# Patient Record
Sex: Female | Born: 1980 | Race: White | Hispanic: No | Smoking: Current every day smoker
Health system: Southern US, Community
[De-identification: ages and names within clinical notes are randomized; demographics above are authoritative.]

## PROBLEM LIST (undated history)

## (undated) DIAGNOSIS — D259 Leiomyoma of uterus, unspecified: Secondary | ICD-10-CM

## (undated) DIAGNOSIS — IMO0002 Reserved for concepts with insufficient information to code with codable children: Secondary | ICD-10-CM

## (undated) DIAGNOSIS — F1020 Alcohol dependence, uncomplicated: Secondary | ICD-10-CM

## (undated) DIAGNOSIS — N809 Endometriosis, unspecified: Secondary | ICD-10-CM

## (undated) HISTORY — PX: OTHER SURGICAL HISTORY: SHX169

---

## 2006-01-20 ENCOUNTER — Ambulatory Visit: Payer: Self-pay | Admitting: Obstetrics and Gynecology

## 2008-02-20 ENCOUNTER — Emergency Department: Payer: Self-pay | Admitting: Emergency Medicine

## 2015-08-26 DIAGNOSIS — F1010 Alcohol abuse, uncomplicated: Secondary | ICD-10-CM | POA: Insufficient documentation

## 2015-08-26 HISTORY — DX: Alcohol abuse, uncomplicated: F10.10

## 2016-02-17 ENCOUNTER — Emergency Department
Admission: EM | Admit: 2016-02-17 | Discharge: 2016-02-17 | Disposition: A | Discharge status: Home / Self Care | Payer: Self-pay | Attending: Emergency Medicine | Admitting: Emergency Medicine

## 2016-02-17 ENCOUNTER — Emergency Department: Payer: Self-pay

## 2016-02-17 ENCOUNTER — Encounter: Payer: Self-pay | Admitting: Medical Oncology

## 2016-02-17 DIAGNOSIS — R0781 Pleurodynia: Secondary | ICD-10-CM

## 2016-02-17 DIAGNOSIS — R05 Cough: Secondary | ICD-10-CM | POA: Insufficient documentation

## 2016-02-17 DIAGNOSIS — F17200 Nicotine dependence, unspecified, uncomplicated: Secondary | ICD-10-CM | POA: Insufficient documentation

## 2016-02-17 DIAGNOSIS — R059 Cough, unspecified: Secondary | ICD-10-CM

## 2016-02-17 DIAGNOSIS — R071 Chest pain on breathing: Secondary | ICD-10-CM | POA: Insufficient documentation

## 2016-02-17 MED ORDER — IBUPROFEN 800 MG PO TABS: 800.0000 mg | ORAL_TABLET | Freq: Three times a day (TID) | ORAL | Status: DC | PRN

## 2016-02-17 MED ORDER — GUAIFENESIN-CODEINE 100-10 MG/5ML PO SOLN: 10.0000 mL | ORAL | Status: DC | PRN

## 2016-02-17 MED ORDER — BACLOFEN 10 MG PO TABS: 10.0000 mg | ORAL_TABLET | Freq: Three times a day (TID) | ORAL | Status: DC

## 2016-02-17 NOTE — ED Notes (Signed)
Developed a cough about 4-5 days ago  But now having pain to left lateral rib area with cough and movement  Denies any trauma
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
## 2016-02-17 NOTE — ED Notes (Signed)
Pt reports that she has had a cough x 2 days, yesterday she noted that she was having left sided rib cage pain with bruising to area. Pt denies sob.

## 2016-02-17 NOTE — Discharge Instructions (Signed)
Nonspecific Chest Pain  Chest pain can be caused by many different conditions. There is always a chance that your pain could be related to something serious, such as a heart attack or a blood clot in your lungs. Chest pain can also be caused by conditions that are not life-threatening. If you have chest pain, it is very important to follow up with your health care provider. CAUSES  Chest pain can be caused by:  Heartburn.  Pneumonia or bronchitis.  Anxiety or stress.  Inflammation around your heart (pericarditis) or lung (pleuritis or pleurisy).  A blood clot in your lung.  A collapsed lung (pneumothorax). It can develop suddenly on its own (spontaneous pneumothorax) or from trauma to the chest.  Shingles infection (varicella-zoster virus).  Heart attack.  Damage to the bones, muscles, and cartilage that make up your chest wall. This can include:  Bruised bones due to injury.  Strained muscles or cartilage due to frequent or repeated coughing or overwork.  Fracture to one or more ribs.  Sore cartilage due to inflammation (costochondritis). RISK FACTORS  Risk factors for chest pain may include:  Activities that increase your risk for trauma or injury to your chest.  Respiratory infections or conditions that cause frequent coughing.  Medical conditions or overeating that can cause heartburn.  Heart disease or family history of heart disease.  Conditions or health behaviors that increase your risk of developing a blood clot.  Having had chicken pox (varicella zoster). SIGNS AND SYMPTOMS Chest pain can feel like:  Burning or tingling on the surface of your chest or deep in your chest.  Crushing, pressure, aching, or squeezing pain.  Dull or sharp pain that is worse when you move, cough, or take a deep breath.  Pain that is also felt in your back, neck, shoulder, or arm, or pain that spreads to any of these areas. Your chest pain may come and go, or it may stay  constant. DIAGNOSIS Lab tests or other studies may be needed to find the cause of your pain. Your health care provider may have you take a test called an ambulatory ECG (electrocardiogram). An ECG records your heartbeat patterns at the time the test is performed. You may also have other tests, such as:  Transthoracic echocardiogram (TTE). During echocardiography, sound waves are used to create a picture of all of the heart structures and to look at how blood flows through your heart.  Transesophageal echocardiogram (TEE).This is a more advanced imaging test that obtains images from inside your body. It allows your health care provider to see your heart in finer detail.  Cardiac monitoring. This allows your health care provider to monitor your heart rate and rhythm in real time.  Holter monitor. This is a portable device that records your heartbeat and can help to diagnose abnormal heartbeats. It allows your health care provider to track your heart activity for several days, if needed.  Stress tests. These can be done through exercise or by taking medicine that makes your heart beat more quickly.  Blood tests.  Imaging tests. TREATMENT  Your treatment depends on what is causing your chest pain. Treatment may include:  Medicines. These may include:  Acid blockers for heartburn.  Anti-inflammatory medicine.  Pain medicine for inflammatory conditions.  Antibiotic medicine, if an infection is present.  Medicines to dissolve blood clots.  Medicines to treat coronary artery disease.  Supportive care for conditions that do not require medicines. This may include:  Resting.  Applying heat  or cold packs to injured areas.  Limiting activities until pain decreases. HOME CARE INSTRUCTIONS  If you were prescribed an antibiotic medicine, finish it all even if you start to feel better.  Avoid any activities that bring on chest pain.  Do not use any tobacco products, including  cigarettes, chewing tobacco, or electronic cigarettes. If you need help quitting, ask your health care provider.  Do not drink alcohol.  Take medicines only as directed by your health care provider.  Keep all follow-up visits as directed by your health care provider. This is important. This includes any further testing if your chest pain does not go away.  If heartburn is the cause for your chest pain, you may be told to keep your head raised (elevated) while sleeping. This reduces the chance that acid will go from your stomach into your esophagus.  Make lifestyle changes as directed by your health care provider. These may include:  Getting regular exercise. Ask your health care provider to suggest some activities that are safe for you.  Eating a heart-healthy diet. A registered dietitian can help you to learn healthy eating options.  Maintaining a healthy weight.  Managing diabetes, if necessary.  Reducing stress. SEEK MEDICAL CARE IF:  Your chest pain does not go away after treatment.  You have a rash with blisters on your chest.  You have a fever. SEEK IMMEDIATE MEDICAL CARE IF:   Your chest pain is worse.  You have an increasing cough, or you cough up blood.  You have severe abdominal pain.  You have severe weakness.  You faint.  You have chills.  You have sudden, unexplained chest discomfort.  You have sudden, unexplained discomfort in your arms, back, neck, or jaw.  You have shortness of breath at any time.  You suddenly start to sweat, or your skin gets clammy.  You feel nauseous or you vomit.  You suddenly feel light-headed or dizzy.  Your heart begins to beat quickly, or it feels like it is skipping beats. These symptoms may represent a serious problem that is an emergency. Do not wait to see if the symptoms will go away. Get medical help right away. Call your local emergency services (911 in the [place] Do not drive yourself to the hospital.   This  information is not intended to replace advice given to you by your health care provider. Make sure you discuss any questions you have with your health care provider.   Document Released: 08/25/2005 Document Revised: 12/06/2014 Document Reviewed: 06/21/2014 Elsevier Interactive Patient Education 2016 [employer]  Cough, Adult Coughing is a reflex that clears your throat and your airways. Coughing helps to heal and protect your lungs. It is normal to cough occasionally, but a cough that happens with other symptoms or lasts a long time may be a sign of a condition that needs treatment. A cough may last only 2-3 weeks (acute), or it may last longer than 8 weeks (chronic). CAUSES Coughing is commonly caused by:  Breathing in substances that irritate your lungs.  A viral or bacterial respiratory infection.  Allergies.  Asthma.  Postnasal drip.  Smoking.  Acid backing up from the stomach into the esophagus (gastroesophageal reflux).  Certain medicines.  Chronic lung problems, including COPD (or rarely, lung cancer).  Other medical conditions such as heart failure. HOME CARE INSTRUCTIONS  Pay attention to any changes in your symptoms. Take these actions to help with your discomfort:  Take medicines only as told by your health  care provider.  If you were prescribed an antibiotic medicine, take it as told by your health care provider. Do not stop taking the antibiotic even if you start to feel better.  Talk with your health care provider before you take a cough suppressant medicine.  Drink enough fluid to keep your urine clear or pale yellow.  If the air is dry, use a cold steam vaporizer or humidifier in your bedroom or your home to help loosen secretions.  Avoid anything that causes you to cough at work or at home.  If your cough is worse at night, try sleeping in a semi-upright position.  Avoid cigarette smoke. If you smoke, quit smoking. If you need help quitting, ask your  health care provider.  Avoid caffeine.  Avoid alcohol.  Rest as needed. SEEK MEDICAL CARE IF:   You have new symptoms.  You cough up pus.  Your cough does not get better after 2-3 weeks, or your cough gets worse.  You cannot control your cough with suppressant medicines and you are losing sleep.  You develop pain that is getting worse or pain that is not controlled with pain medicines.  You have a fever.  You have unexplained weight loss.  You have night sweats. SEEK IMMEDIATE MEDICAL CARE IF:  You cough up blood.  You have difficulty breathing.  Your heartbeat is very fast.   This information is not intended to replace advice given to you by your health care provider. Make sure you discuss any questions you have with your health care provider.   Document Released: 05/14/2011 Document Revised: 08/06/2015 Document Reviewed: 01/22/2015 Elsevier Interactive Patient Education [employer].

## 2016-02-17 NOTE — ED Notes (Signed)
Pt alert and oriented X4, active, cooperative, pt in NAD. RR even and unlabored, color WNL.  Pt informed to return if any life threatening symptoms occur.   

## 2016-08-01 ENCOUNTER — Emergency Department
Admission: EM | Admit: 2016-08-01 | Discharge: 2016-08-01 | Disposition: A | Discharge status: Home / Self Care | Payer: Self-pay | Attending: Emergency Medicine | Admitting: Emergency Medicine

## 2016-08-01 ENCOUNTER — Emergency Department: Payer: Self-pay

## 2016-08-01 ENCOUNTER — Encounter: Payer: Self-pay | Admitting: Emergency Medicine

## 2016-08-01 DIAGNOSIS — S0990XA Unspecified injury of head, initial encounter: Secondary | ICD-10-CM | POA: Insufficient documentation

## 2016-08-01 DIAGNOSIS — S0083XA Contusion of other part of head, initial encounter: Secondary | ICD-10-CM | POA: Insufficient documentation

## 2016-08-01 DIAGNOSIS — Y929 Unspecified place or not applicable: Secondary | ICD-10-CM | POA: Insufficient documentation

## 2016-08-01 DIAGNOSIS — Y9389 Activity, other specified: Secondary | ICD-10-CM | POA: Insufficient documentation

## 2016-08-01 DIAGNOSIS — F1020 Alcohol dependence, uncomplicated: Secondary | ICD-10-CM | POA: Insufficient documentation

## 2016-08-01 DIAGNOSIS — F17210 Nicotine dependence, cigarettes, uncomplicated: Secondary | ICD-10-CM | POA: Insufficient documentation

## 2016-08-01 DIAGNOSIS — Y999 Unspecified external cause status: Secondary | ICD-10-CM | POA: Insufficient documentation

## 2016-08-01 HISTORY — DX: Alcohol dependence, uncomplicated: F10.20

## 2016-08-01 HISTORY — DX: Endometriosis, unspecified: N80.9

## 2016-08-01 HISTORY — DX: Reserved for concepts with insufficient information to code with codable children: IMO0002

## 2016-08-01 HISTORY — DX: Leiomyoma of uterus, unspecified: D25.9
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
## 2016-12-03 ENCOUNTER — Emergency Department: Payer: Self-pay

## 2016-12-03 ENCOUNTER — Emergency Department
Admission: EM | Admit: 2016-12-03 | Discharge: 2016-12-03 | Disposition: A | Discharge status: Home / Self Care | Payer: Self-pay | Attending: Emergency Medicine | Admitting: Emergency Medicine

## 2016-12-03 ENCOUNTER — Encounter: Payer: Self-pay | Admitting: Emergency Medicine

## 2016-12-03 DIAGNOSIS — F17210 Nicotine dependence, cigarettes, uncomplicated: Secondary | ICD-10-CM | POA: Insufficient documentation

## 2016-12-03 DIAGNOSIS — Y939 Activity, unspecified: Secondary | ICD-10-CM | POA: Insufficient documentation

## 2016-12-03 DIAGNOSIS — Y929 Unspecified place or not applicable: Secondary | ICD-10-CM | POA: Insufficient documentation

## 2016-12-03 DIAGNOSIS — W19XXXA Unspecified fall, initial encounter: Secondary | ICD-10-CM | POA: Insufficient documentation

## 2016-12-03 DIAGNOSIS — Y999 Unspecified external cause status: Secondary | ICD-10-CM | POA: Insufficient documentation

## 2016-12-03 DIAGNOSIS — S20212A Contusion of left front wall of thorax, initial encounter: Secondary | ICD-10-CM | POA: Insufficient documentation

## 2016-12-03 DIAGNOSIS — R109 Unspecified abdominal pain: Secondary | ICD-10-CM | POA: Insufficient documentation

## 2016-12-03 MED ORDER — DIAZEPAM 5 MG PO TABS: 5.0000 mg | ORAL_TABLET | Freq: Three times a day (TID) | ORAL | 0 refills | Status: DC | PRN

## 2016-12-03 MED ORDER — OXYCODONE-ACETAMINOPHEN 5-325 MG PO TABS: 2.0000 | ORAL_TABLET | Freq: Four times a day (QID) | ORAL | 0 refills | Status: DC | PRN

## 2016-12-03 MED ORDER — KETOROLAC TROMETHAMINE 60 MG/2ML IM SOLN
60.0000 mg | Freq: Once | INTRAMUSCULAR | Status: AC
  Administered 2016-12-03: 60 mg via INTRAMUSCULAR
  Filled 2016-12-03: qty 2

## 2016-12-03 MED ORDER — IBUPROFEN 800 MG PO TABS: 800.0000 mg | ORAL_TABLET | Freq: Three times a day (TID) | ORAL | 0 refills | Status: DC | PRN
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
## 2017-04-24 IMAGING — CR DG RIBS W/ CHEST 3+V*L*
4 series · 4 of 4 positions shown · non-contrast
Comparison: None.

CLINICAL DATA: Cough for 2 days, left side rib cage pain

EXAM:
LEFT RIBS AND CHEST - 3+ VIEW

[chest pa]
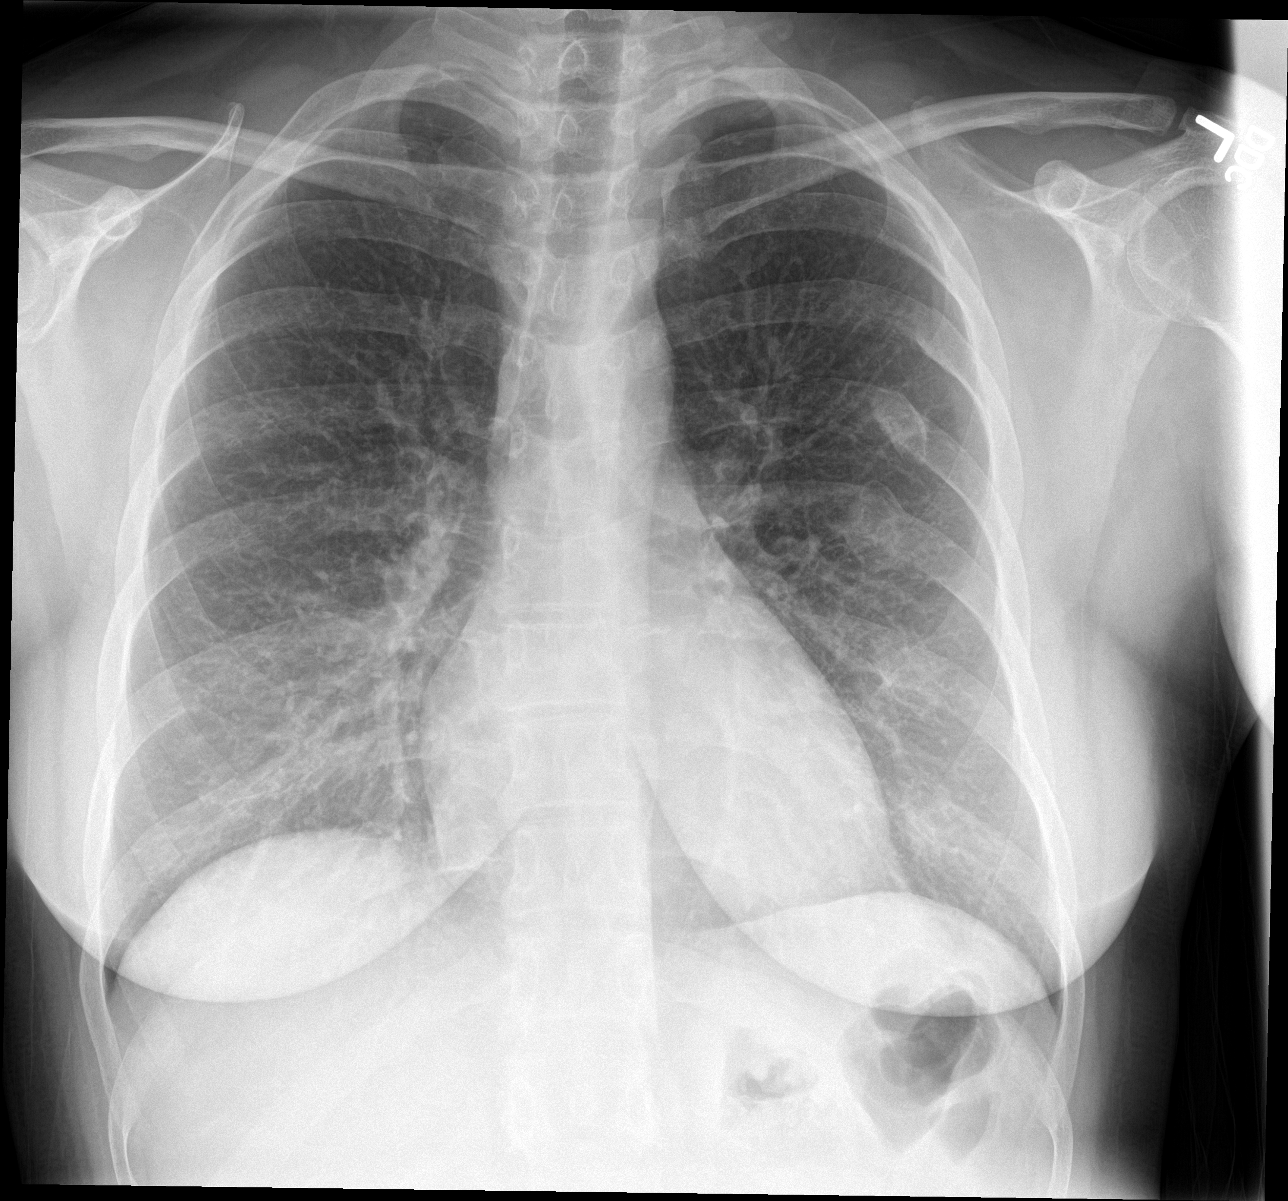

[rib pa]
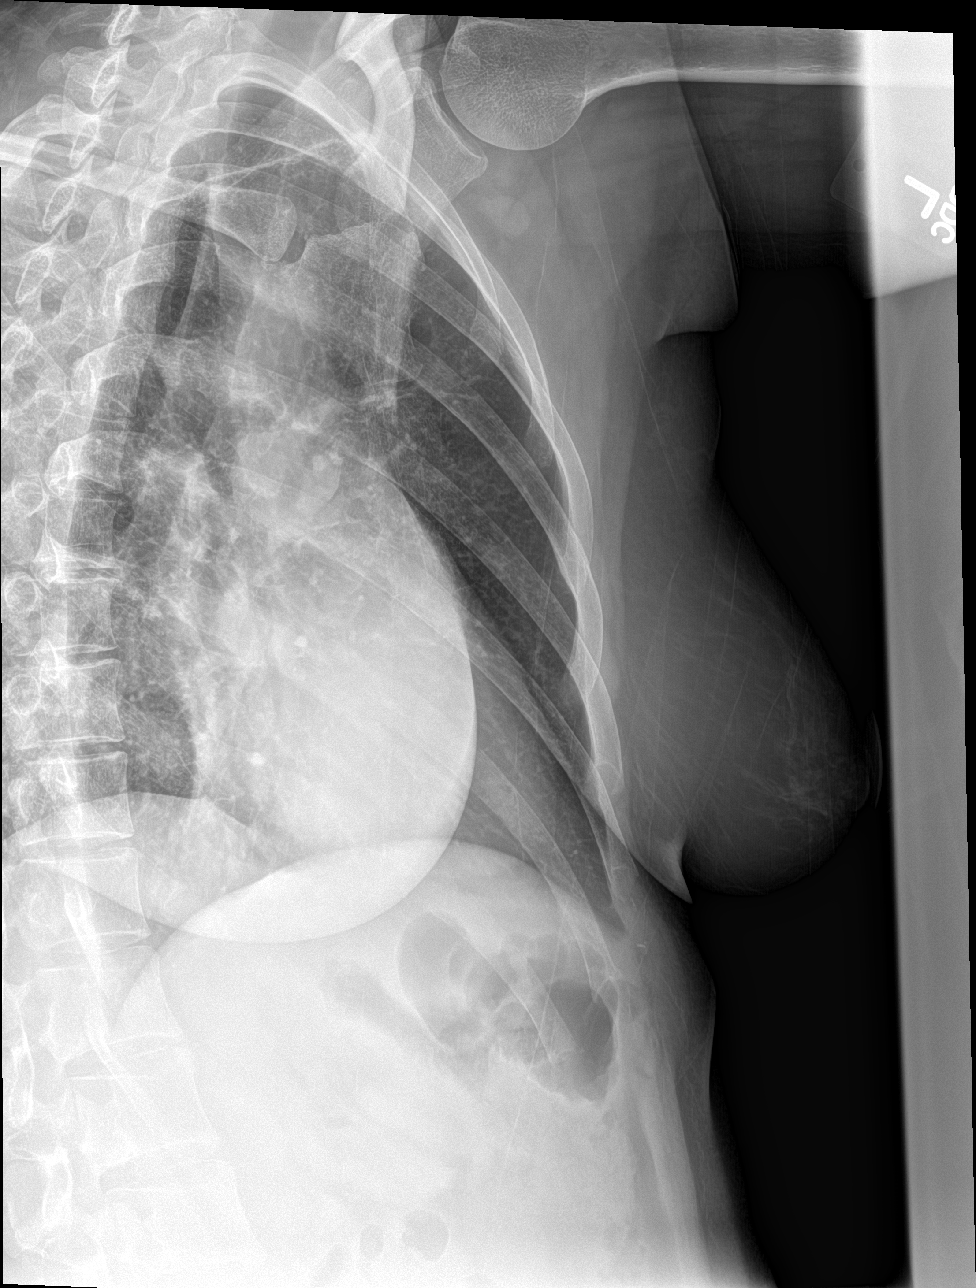

[rib pa obl]
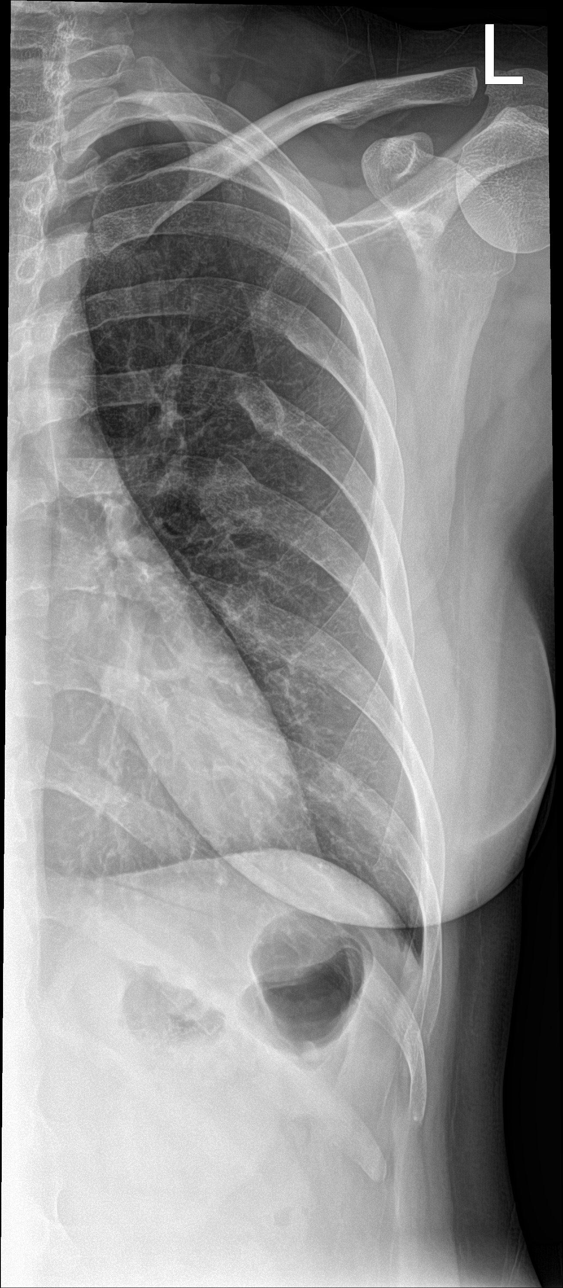

[rib ap]
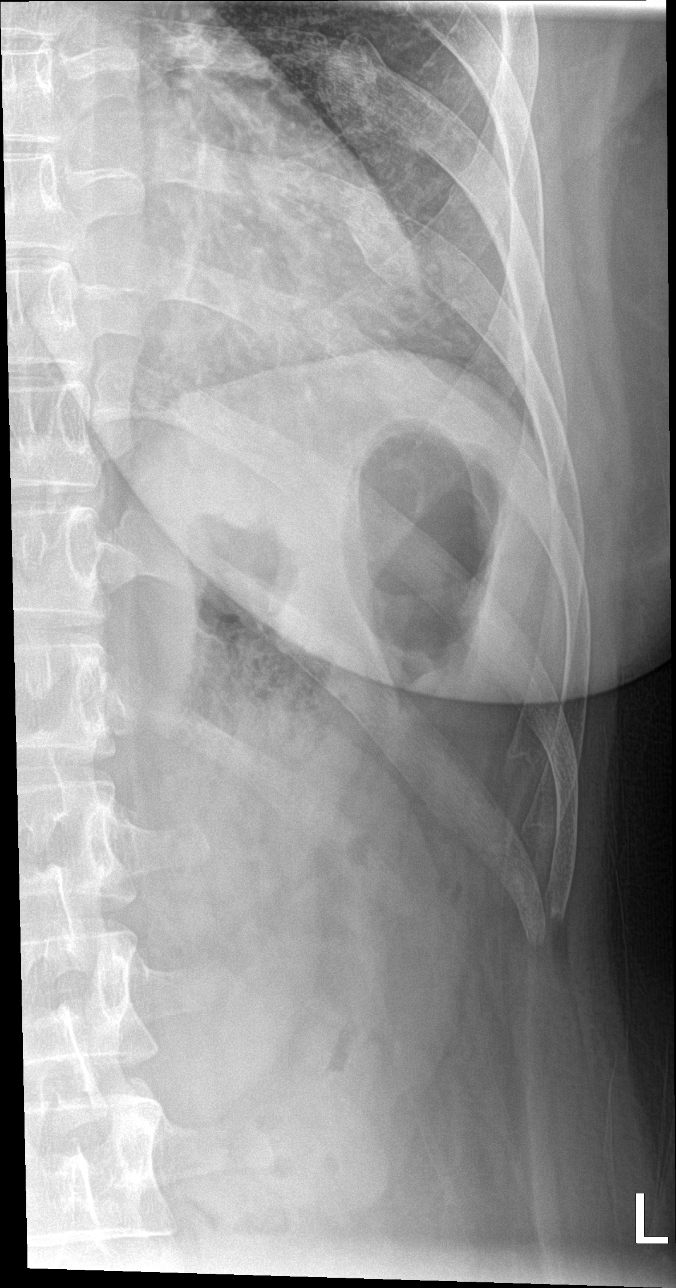

[4 of 4 positions shown; findings below may reference images not displayed]

FINDINGS: Four views left ribs submitted. No acute infiltrate or pulmonary
edema. Old fracture of the left posterior fifth, sixth, seventh and
probable eighth rib. There is no pneumothorax. No definite acute rib
fracture is identified.
IMPRESSION: No acute rib fracture is identified. Old fractures of the left
fifth, 6, 7 and probable eighth rib.

## 2018-01-18 ENCOUNTER — Encounter: Payer: Self-pay | Admitting: Intensive Care

## 2018-01-18 ENCOUNTER — Emergency Department
Admission: EM | Admit: 2018-01-18 | Discharge: 2018-01-18 | Disposition: A | Discharge status: Home / Self Care | Payer: Self-pay | Attending: Emergency Medicine | Admitting: Emergency Medicine

## 2018-01-18 DIAGNOSIS — F17210 Nicotine dependence, cigarettes, uncomplicated: Secondary | ICD-10-CM | POA: Insufficient documentation

## 2018-01-18 DIAGNOSIS — Z79899 Other long term (current) drug therapy: Secondary | ICD-10-CM | POA: Insufficient documentation

## 2018-01-18 DIAGNOSIS — J111 Influenza due to unidentified influenza virus with other respiratory manifestations: Secondary | ICD-10-CM | POA: Insufficient documentation

## 2018-01-18 MED ORDER — OSELTAMIVIR PHOSPHATE 75 MG PO CAPS: 75.0000 mg | ORAL_CAPSULE | Freq: Two times a day (BID) | ORAL | 0 refills | Status: AC

## 2018-01-18 NOTE — ED Triage Notes (Signed)
Pt c/o cough, sore throat, and body aches for a few days. Last took theraflu and ibuprofen around 12

## 2018-01-18 NOTE — ED Notes (Signed)
See triage note   States she developed body aches ,vomiting and diarrhea for the past 2-3 days   Afebrile on arrival last time vomiting was this am
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
## 2019-02-28 ENCOUNTER — Encounter: Payer: Self-pay | Admitting: Emergency Medicine

## 2019-02-28 ENCOUNTER — Emergency Department: Payer: Self-pay

## 2019-02-28 ENCOUNTER — Emergency Department
Admission: EM | Admit: 2019-02-28 | Discharge: 2019-02-28 | Disposition: A | Discharge status: Home / Self Care | Payer: Self-pay | Attending: Student in an Organized Health Care Education/Training Program | Admitting: Student in an Organized Health Care Education/Training Program

## 2019-02-28 ENCOUNTER — Other Ambulatory Visit: Payer: Self-pay

## 2019-02-28 DIAGNOSIS — R6889 Other general symptoms and signs: Secondary | ICD-10-CM

## 2019-02-28 DIAGNOSIS — R509 Fever, unspecified: Secondary | ICD-10-CM | POA: Insufficient documentation

## 2019-02-28 DIAGNOSIS — R1011 Right upper quadrant pain: Secondary | ICD-10-CM | POA: Insufficient documentation

## 2019-02-28 DIAGNOSIS — R109 Unspecified abdominal pain: Secondary | ICD-10-CM

## 2019-02-28 DIAGNOSIS — J111 Influenza due to unidentified influenza virus with other respiratory manifestations: Secondary | ICD-10-CM | POA: Insufficient documentation

## 2019-02-28 DIAGNOSIS — F17210 Nicotine dependence, cigarettes, uncomplicated: Secondary | ICD-10-CM | POA: Insufficient documentation

## 2019-02-28 LAB — URINALYSIS, COMPLETE (UACMP) WITH MICROSCOPIC
Bacteria, UA: NONE SEEN
RBC / HPF: >50 RBC/hpf — ABNORMAL HIGH (ref 0–5)
Specific Gravity, Urine: 1.022 (ref 1.005–1.030)

## 2019-02-28 LAB — POCT PREGNANCY, URINE: Preg Test, Ur: NEGATIVE

## 2019-02-28 MED ORDER — ACETAMINOPHEN 500 MG PO TABS
1000.0000 mg | ORAL_TABLET | Freq: Once | ORAL | Status: AC
  Administered 2019-02-28: 1000 mg via ORAL
  Filled 2019-02-28: qty 2

## 2019-02-28 MED ORDER — BACLOFEN 10 MG PO TABS: 10.0000 mg | ORAL_TABLET | Freq: Three times a day (TID) | ORAL | 0 refills | Status: DC | PRN

## 2019-02-28 MED ORDER — HYDROCODONE-ACETAMINOPHEN 5-325 MG PO TABS: 1.0000 | ORAL_TABLET | ORAL | 0 refills | Status: DC | PRN

## 2019-02-28 NOTE — ED Triage Notes (Signed)
Pt in via POV, reports cough, congestion, body aches x 3 days.  Ambulatory to triage, NAD noted at this time.

## 2019-02-28 NOTE — ED Notes (Signed)
Patient transported to Ultrasound 

## 2019-02-28 NOTE — Discharge Instructions (Signed)
You have a viral illness which can have symptoms like muscle aches, fevers, chills, runny nose, cough, sneezing, sore throat, vomiting or diarrhea. One of the viruses that can cause this is SARS- CoV-2, the virus that causes COVID-19, also known as the novel coronavirus. You could also have a different viral infection such as the common cold or flu. Most patients with viral illness including COVID-19 have mild symptoms and recover on their own. Resting, staying hydrated, and sleeping are helpful. Today we think you are well enough to go home and treat your symptoms with oral liquids, and medicine for fevers, cough, and pain.  We generally do not do COVID-19 testing on people with mild symptoms who are being discharged from the Emergency Department or Clinic.   If we did a test for COVID-19 the results will not be available for several days. We will call you with the result. Please DO NOT CONTACT THE EMERGENCY DEPARTMENT OR CLINIC FOR RESULTS OF THIS TEST.   Please follow the precautions below:  Stay home for at least 7 days after your symptoms began OR for 3 days after your fever ends, whichever takes longer. ?  If people live with you they should also stay home and avoid contact with others for 14 days.?   ISOLATION GUIDANCE FOR POSSIBLE COVID Most people with cough and fever have an illness caused by a virus. One is COVID-19. Not all people with these infections are being tested for the virus that causes COVID-19. People who might have COVID but are not being tested should still try to prevent the spread of the infection.  These instructions are modified recommendations from the [place].  People who might have COVID-19 should follow the instructions below until a doctor or health department says they can stop.  Stay home unless you need to see a doctor Stop doing things outside your home except for getting medical care. Do not go to work, school,  or public areas; and do not use public transportation or taxis.  Call ahead before visiting the doctor Before your appointment, call the doctor's office and tell them about your symptoms. This will help them take steps to keep other people from getting infected.   Keep track of your symptoms Symptoms are the things you feel, like fever or trouble breathing. Go to your doctor or the ER if you think you are getting worse, like having more trouble breathing. Call the doctor's office and tell them about your symptoms. This will help them take steps to keep other people from getting sick.   Wear a face mask You should wear a face mask that covers your nose and mouth when you are in the same room with other people and when you visit the doctor's office. People who live with you or visit you should also wear a face mask when they are in the same room with you.  Separate yourself from other people in your home You should stay in a different room from other people in your home. You should stay separate from your family members as much as possible. You should use a separate bathroom if you can.  Avoid sharing things in your house Don't share dishes, drinking glasses, cups, eating utensils, towels, bedding, or other things with people in your home. After using these things please wash them really well with soap and water.  Cover your coughs and sneezes Cover your mouth and nose with a tissue when you  cough or sneeze, or you can cough or sneeze into your sleeve. Throw used tissues in a trash can that has a bag in it, and immediately wash your hands with soap and water for at least 20 seconds. If you use an alcohol-based hand rub please rub your hands together for 20 seconds.  Wash your [facility] your hands often and very well with soap and water for at least 20 seconds. If your hands are not visibly dirty you can use an alcohol-based hand sanitizer. Don't touch your eyes, nose, or mouth with unwashed  hands.    Instructions for People Helping Care for Patients with Possible COVID-19 Follow your doctor's instructions Make sure that you understand and can help the patient follow any instructions for care.  Provide for the patient's basic needs You should help the patient with basic needs in the home and provide support for getting groceries, prescriptions, and other personal needs.  Keep track of the patient's symptoms If they are getting sicker, call his or her doctor. This will help the doctor's office take steps to keep other people from getting infected.  Limit the number of people who have contact with the patient If possible, have only one caregiver for the patient. Other family members should stay in another home or place of residence. If they can't, they should stay in another room and stay separated from the patient as much as possible. Keep one bathroom JUST for the patient if you can.  Only allow visitors if they MUST be in the home.  Keep older adults, very young children, and other sick people away from the patient Keep older adults, very young children, and people who have compromised immune systems or chronic health conditions away from the patient. This includes people with chronic heart, lung, or kidney conditions, diabetes, and cancer.  Wash your hands often Avoid touching your eyes, nose, and mouth with unwashed hands. Wash your hands often and thoroughly with soap and water for at least 20 seconds. You can use an alcohol-based hand sanitizer if soap and water are not available and if your hands are not visibly dirty.  Use disposable paper towels to dry your hands. If not available, use dedicated cloth towels and replace them when they become wet.  Avoid contamination from face masks and gloves Wear a disposable face mask and gloves whenever you are touching the patient, things in their room or bathroom, or things that can have their body fluid on them, like bedding  or dishes, or blood, vomit, urine, or feces (poop).  Ensure the mask fits over your nose and mouth tightly, and do not touch it at all while you are wearing it. Throw out disposable facemasks and gloves after using them. Do not reuse. Wash your hands immediately after removing your facemask and gloves. If your personal clothing becomes dirty with a patient's body fluids, carefully remove clothing and launder. Wash your hands after handling dirty clothing. Place all used disposable facemasks, gloves, and other waste in a lined container before disposing them with other household garbage.  Remove gloves and wash your hands immediately after handling these items.  Do not share dishes, glasses, or other household items with the patient Avoid sharing household items. You should not share dishes, drinking glasses, cups, eating utensils, towels, bedding, or other items with a patient who is confirmed to have, or being evaluated for, COVID-19 infection.  After the person uses these items, you should wash them very well with soap and  water.  Wash laundry thoroughly Immediately remove and wash clothes or bedding that have blood, body fluids, and/or secretions or excretions, such as sweat, saliva, sputum, nasal mucus, vomit, urine, or feces, on them.  Wear gloves when handling laundry from the patient.  Read and follow directions on labels of laundry or clothing items and detergent. In general, wash and dry with the warmest temperatures recommended on the label.  Clean all areas the individual has used  Clean all touchable surfaces, such as counters, tabletops, doorknobs, bathroom fixtures, toilets, phones, keyboards, tablets, and bedside tables, every day. Also, clean any surfaces that may have blood, body fluids, and/or secretions or excretions on them. Wear gloves when cleaning surfaces the patient has come in contact with.  Use a diluted bleach solution (dilute bleach with 1 part bleach and 10 parts  water) or a household disinfectant with a label that says EPA-registered for coronaviruses. To make a bleach solution at home, add 1 tablespoon of bleach to 1 quart (4 cups) of water. For a larger supply, add  cup of bleach to 1 gallon (16 cups) of water.  Read labels of cleaning products and follow recommendations provided on product labels. Labels contain instructions for safe and effective use of the cleaning product including precautions you should take when applying the product, such as wearing gloves or eye protection and making sure you have good ventilation during use of the product.  Remove gloves and wash hands immediately after cleaning.  Monitor yourself for signs and symptoms of illness Caregivers and household members are considered close contacts, should monitor their health, and will be asked to limit movement outside of the home as much as possible.   If you have additional questions Nutritional therapist or Healthcare Provider [facility] website ([email or web address]) The [place] hotline 336-70COVID Your local health department   This guidance is subject to change. For the most up to date guidance, check the state Department of Health website at [email or web address]
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
## 2019-02-28 NOTE — ED Notes (Signed)
PT states left flank pain and RUQ abdominal pain as well as cough and fever.  Pt with mild symptoms.

## 2019-09-25 ENCOUNTER — Other Ambulatory Visit: Payer: Self-pay | Admitting: *Deleted

## 2019-09-25 DIAGNOSIS — Z20822 Contact with and (suspected) exposure to covid-19: Secondary | ICD-10-CM

## 2019-09-27 LAB — NOVEL CORONAVIRUS, NAA: SARS-CoV-2, NAA: NOT DETECTED

## 2020-05-05 IMAGING — DX PORTABLE CHEST - 1 VIEW
1 series · 1 of 1 positions shown · non-contrast
Comparison: 12/03/2016

CLINICAL DATA: left flank pain and RUQ abdominal pain as well as
cough, congestion and fever x 3 weeks

EXAM:
PORTABLE CHEST 1 VIEW

[chest ap]
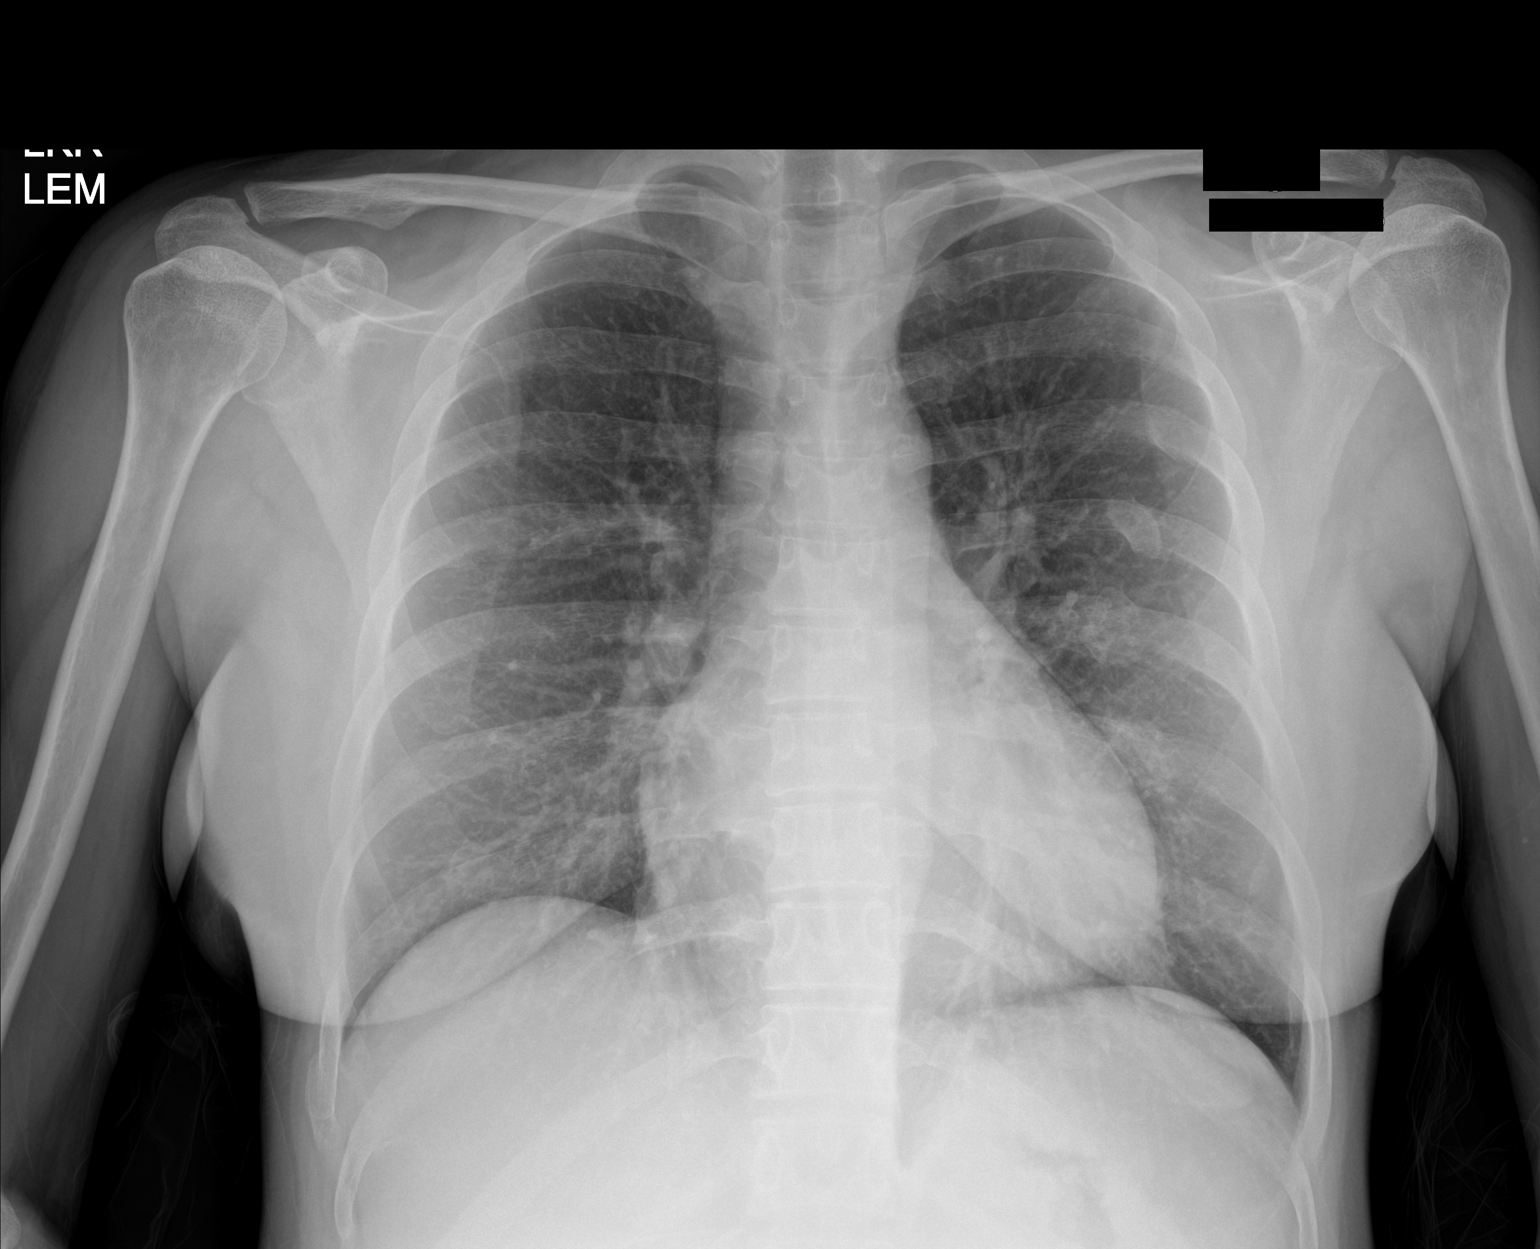

[1 of 1 positions shown; findings below may reference images not displayed]

FINDINGS: Cardiac silhouette is normal in size. No mediastinal or hilar
masses. No evidence of adenopathy.

Are prominent bronchovascular markings similar to the study. Lungs
otherwise clear. No pleural effusion or pneumothorax.

There are old healed left rib fractures, stable.
IMPRESSION: No acute cardiopulmonary disease.

## 2020-07-31 DIAGNOSIS — R251 Tremor, unspecified: Secondary | ICD-10-CM | POA: Insufficient documentation

## 2020-07-31 DIAGNOSIS — Z720 Tobacco use: Secondary | ICD-10-CM | POA: Insufficient documentation

## 2020-07-31 DIAGNOSIS — F411 Generalized anxiety disorder: Secondary | ICD-10-CM | POA: Insufficient documentation

## 2020-07-31 DIAGNOSIS — K760 Fatty (change of) liver, not elsewhere classified: Secondary | ICD-10-CM | POA: Insufficient documentation

## 2020-11-26 ENCOUNTER — Ambulatory Visit: Admit: 2020-11-26 | Payer: Self-pay | Admitting: Obstetrics & Gynecology

## 2020-11-26 SURGERY — HYSTERECTOMY, TOTAL, LAPAROSCOPIC, WITH SALPINGECTOMY
Anesthesia: General | Laterality: Bilateral

## 2020-12-06 ENCOUNTER — Ambulatory Visit
Admission: EM | Admit: 2020-12-06 | Discharge: 2020-12-06 | Discharge status: Against Medical Advice | Payer: BLUE CROSS/BLUE SHIELD

## 2020-12-06 ENCOUNTER — Other Ambulatory Visit: Payer: Self-pay

## 2020-12-08 ENCOUNTER — Other Ambulatory Visit: Payer: Self-pay

## 2020-12-08 ENCOUNTER — Ambulatory Visit
Admission: EM | Admit: 2020-12-08 | Discharge: 2020-12-08 | Disposition: A | Discharge status: Home / Self Care | Payer: BLUE CROSS/BLUE SHIELD | Attending: Emergency Medicine | Admitting: Emergency Medicine

## 2020-12-08 ENCOUNTER — Encounter: Payer: Self-pay | Admitting: Emergency Medicine

## 2020-12-08 DIAGNOSIS — U071 COVID-19: Secondary | ICD-10-CM | POA: Diagnosis not present

## 2020-12-08 DIAGNOSIS — J069 Acute upper respiratory infection, unspecified: Secondary | ICD-10-CM

## 2020-12-08 LAB — SARS CORONAVIRUS 2 (TAT 6-24 HRS): SARS Coronavirus 2: POSITIVE — AB

## 2020-12-08 MED ORDER — ONDANSETRON 8 MG PO TBDP: 8.0000 mg | ORAL_TABLET | Freq: Three times a day (TID) | ORAL | 0 refills | Status: DC | PRN

## 2020-12-08 MED ORDER — PROMETHAZINE-DM 6.25-15 MG/5ML PO SYRP: 5.0000 mL | ORAL_SOLUTION | Freq: Four times a day (QID) | ORAL | 0 refills | Status: DC | PRN

## 2020-12-08 MED ORDER — BENZONATATE 100 MG PO CAPS: 200.0000 mg | ORAL_CAPSULE | Freq: Three times a day (TID) | ORAL | 0 refills | Status: DC

## 2020-12-08 NOTE — Discharge Instructions (Addendum)
Isolate at home until the results of your COVID test are back.  If your test is positive then you will need to quarantine for 5 days from the onset of your symptoms.  Considering you had a profound change in your symptoms and spiked a fever today we will count this as day 0.  After 5 days you can break quarantine if your symptoms have improved and you have not run a fever for 24 hours.  Use over-the-counter Tylenol and ibuprofen as needed for fever and body aches.  Use the Zofran ODT every 8 hours as needed for nausea.  Use the Tessalon Perles during the day for cough and the Promethazine DM cough syrup at bedtime as it will make you drowsy.  If you develop shortness of breath-especially at rest, you are unable to speak full sentences, or you develop bluing of your lips you need to go to the ER for evaluation.
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
## 2020-12-08 NOTE — ED Triage Notes (Signed)
Pt c/o cough, fever, body aches, nasal congestion. Started about a week ago. She was test for covid at the alpha testing site and was negative.

## 2022-01-07 ENCOUNTER — Other Ambulatory Visit: Payer: Self-pay | Admitting: Obstetrics and Gynecology

## 2022-01-07 DIAGNOSIS — Z1231 Encounter for screening mammogram for malignant neoplasm of breast: Secondary | ICD-10-CM

## 2022-01-13 ENCOUNTER — Other Ambulatory Visit: Payer: Self-pay

## 2022-01-13 ENCOUNTER — Ambulatory Visit
Admission: RE | Admit: 2022-01-13 | Discharge: 2022-01-13 | Disposition: A | Discharge status: Home / Self Care | Payer: BLUE CROSS/BLUE SHIELD | Source: Ambulatory Visit | Attending: Obstetrics and Gynecology | Admitting: Obstetrics and Gynecology

## 2022-01-13 DIAGNOSIS — Z1231 Encounter for screening mammogram for malignant neoplasm of breast: Secondary | ICD-10-CM | POA: Diagnosis not present

## 2022-02-03 ENCOUNTER — Ambulatory Visit: Payer: BLUE CROSS/BLUE SHIELD | Admitting: Nurse Practitioner

## 2022-02-03 ENCOUNTER — Other Ambulatory Visit: Payer: Self-pay

## 2022-02-03 DIAGNOSIS — N946 Dysmenorrhea, unspecified: Secondary | ICD-10-CM | POA: Insufficient documentation
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
## 2022-03-15 ENCOUNTER — Ambulatory Visit: Payer: BLUE CROSS/BLUE SHIELD | Admitting: Nurse Practitioner

## 2023-01-06 DIAGNOSIS — N809 Endometriosis, unspecified: Secondary | ICD-10-CM | POA: Diagnosis not present

## 2023-01-06 DIAGNOSIS — N946 Dysmenorrhea, unspecified: Secondary | ICD-10-CM | POA: Diagnosis not present

## 2023-01-06 DIAGNOSIS — Z01419 Encounter for gynecological examination (general) (routine) without abnormal findings: Secondary | ICD-10-CM | POA: Diagnosis not present

## 2023-01-06 DIAGNOSIS — N920 Excessive and frequent menstruation with regular cycle: Secondary | ICD-10-CM | POA: Diagnosis not present

## 2023-01-07 ENCOUNTER — Other Ambulatory Visit: Payer: Self-pay | Admitting: Obstetrics and Gynecology

## 2023-01-07 DIAGNOSIS — Z1231 Encounter for screening mammogram for malignant neoplasm of breast: Secondary | ICD-10-CM

## 2023-01-14 ENCOUNTER — Other Ambulatory Visit: Payer: Self-pay | Admitting: Family

## 2023-01-14 DIAGNOSIS — J0140 Acute pansinusitis, unspecified: Secondary | ICD-10-CM | POA: Diagnosis not present

## 2023-01-14 DIAGNOSIS — R519 Headache, unspecified: Secondary | ICD-10-CM | POA: Diagnosis not present

## 2023-01-14 DIAGNOSIS — J069 Acute upper respiratory infection, unspecified: Secondary | ICD-10-CM | POA: Diagnosis not present

## 2023-01-14 DIAGNOSIS — Z03818 Encounter for observation for suspected exposure to other biological agents ruled out: Secondary | ICD-10-CM | POA: Diagnosis not present

## 2023-01-14 DIAGNOSIS — J029 Acute pharyngitis, unspecified: Secondary | ICD-10-CM | POA: Diagnosis not present

## 2023-01-14 DIAGNOSIS — Z6823 Body mass index (BMI) 23.0-23.9, adult: Secondary | ICD-10-CM | POA: Diagnosis not present

## 2023-02-08 ENCOUNTER — Other Ambulatory Visit: Payer: Self-pay | Admitting: Family

## 2023-02-08 ENCOUNTER — Other Ambulatory Visit: Payer: 59

## 2023-02-08 DIAGNOSIS — E782 Mixed hyperlipidemia: Secondary | ICD-10-CM

## 2023-02-08 DIAGNOSIS — R1010 Upper abdominal pain, unspecified: Secondary | ICD-10-CM | POA: Diagnosis not present

## 2023-02-08 DIAGNOSIS — R7303 Prediabetes: Secondary | ICD-10-CM

## 2023-02-08 DIAGNOSIS — E039 Hypothyroidism, unspecified: Secondary | ICD-10-CM | POA: Diagnosis not present

## 2023-02-08 DIAGNOSIS — K760 Fatty (change of) liver, not elsewhere classified: Secondary | ICD-10-CM

## 2023-02-08 DIAGNOSIS — E559 Vitamin D deficiency, unspecified: Secondary | ICD-10-CM | POA: Diagnosis not present

## 2023-02-08 DIAGNOSIS — E538 Deficiency of other specified B group vitamins: Secondary | ICD-10-CM | POA: Diagnosis not present

## 2023-02-09 ENCOUNTER — Encounter: Payer: Self-pay | Admitting: Family

## 2023-02-09 DIAGNOSIS — R1084 Generalized abdominal pain: Secondary | ICD-10-CM

## 2023-02-09 LAB — CMP14+EGFR
ALT: 13 IU/L (ref 0–32)
AST: 17 IU/L (ref 0–40)
Albumin/Globulin Ratio: 2.3 — ABNORMAL HIGH (ref 1.2–2.2)
Albumin: 4.5 g/dL (ref 3.9–4.9)
Alkaline Phosphatase: 60 IU/L (ref 44–121)
BUN/Creatinine Ratio: 13 (ref 9–23)
BUN: 7 mg/dL (ref 6–24)
Bilirubin Total: <0.2 mg/dL (ref 0.0–1.2)
CO2: 22 mmol/L (ref 20–29)
Calcium: 9.2 mg/dL (ref 8.7–10.2)
Chloride: 105 mmol/L (ref 96–106)
Creatinine, Ser: 0.55 mg/dL — ABNORMAL LOW (ref 0.57–1.00)
Globulin, Total: 2 g/dL (ref 1.5–4.5)
Glucose: 87 mg/dL (ref 70–99)
Potassium: 4.4 mmol/L (ref 3.5–5.2)
Sodium: 139 mmol/L (ref 134–144)
Total Protein: 6.5 g/dL (ref 6.0–8.5)
eGFR: 117 mL/min/{1.73_m2} (ref >59)

## 2023-02-09 LAB — CBC WITH DIFFERENTIAL
Basophils Absolute: 0 10*3/uL (ref 0.0–0.2)
Basos: 1 %
EOS (ABSOLUTE): 0.1 10*3/uL (ref 0.0–0.4)
Eos: 1 %
Hematocrit: 36.6 % (ref 34.0–46.6)
Hemoglobin: 12.6 g/dL (ref 11.1–15.9)
Immature Grans (Abs): 0 10*3/uL (ref 0.0–0.1)
Immature Granulocytes: 0 %
Lymphocytes Absolute: 2.3 10*3/uL (ref 0.7–3.1)
Lymphs: 44 %
MCH: 31.9 pg (ref 26.6–33.0)
MCHC: 34.4 g/dL (ref 31.5–35.7)
MCV: 93 fL (ref 79–97)
Monocytes Absolute: 0.4 10*3/uL (ref 0.1–0.9)
Monocytes: 8 %
Neutrophils Absolute: 2.4 10*3/uL (ref 1.4–7.0)
Neutrophils: 46 %
RBC: 3.95 x10E6/uL (ref 3.77–5.28)
RDW: 11.8 % (ref 11.7–15.4)
WBC: 5.2 10*3/uL (ref 3.4–10.8)

## 2023-02-09 LAB — LIPID PANEL
Chol/HDL Ratio: 1.6 ratio (ref 0.0–4.4)
Cholesterol, Total: 117 mg/dL (ref 100–199)
HDL: 73 mg/dL (ref >39)
LDL Chol Calc (NIH): 28 mg/dL (ref 0–99)
Triglycerides: 81 mg/dL (ref 0–149)
VLDL Cholesterol Cal: 16 mg/dL (ref 5–40)

## 2023-02-09 LAB — HEMOGLOBIN A1C
Est. average glucose Bld gHb Est-mCnc: 105 mg/dL
Hgb A1c MFr Bld: 5.3 % (ref 4.8–5.6)

## 2023-02-09 LAB — VITAMIN D 25 HYDROXY (VIT D DEFICIENCY, FRACTURES): Vit D, 25-Hydroxy: 69.6 ng/mL (ref 30.0–100.0)

## 2023-02-09 LAB — VITAMIN B12: Vitamin B-12: 612 pg/mL (ref 232–1245)

## 2023-02-09 LAB — TSH: TSH: 1.36 u[IU]/mL (ref 0.450–4.500)

## 2023-02-15 LAB — LIPASE: Lipase: 43 U/L (ref 14–72)

## 2023-02-15 LAB — SPECIMEN STATUS REPORT

## 2023-02-15 LAB — AMYLASE: Amylase: 67 U/L (ref 31–110)

## 2023-04-20 ENCOUNTER — Other Ambulatory Visit: Payer: Self-pay | Admitting: Family

## 2023-04-22 ENCOUNTER — Other Ambulatory Visit: Payer: Self-pay | Admitting: Family

## 2023-04-23 MED ORDER — AMOXICILLIN-POT CLAVULANATE 875-125 MG PO TABS: 1.0000 | ORAL_TABLET | Freq: Two times a day (BID) | ORAL | 0 refills | Status: DC

## 2023-04-23 MED ORDER — ALPRAZOLAM 0.25 MG PO TABS: 0.2500 mg | ORAL_TABLET | Freq: Every day | ORAL | 1 refills | Status: DC | PRN

## 2023-05-16 ENCOUNTER — Other Ambulatory Visit: Payer: Self-pay | Admitting: Family

## 2023-10-21 ENCOUNTER — Encounter: Payer: Self-pay | Admitting: Family

## 2023-10-21 ENCOUNTER — Ambulatory Visit (INDEPENDENT_AMBULATORY_CARE_PROVIDER_SITE_OTHER): Payer: 59 | Admitting: Family

## 2023-10-21 VITALS — BP 120/80 | HR 99 | Ht 62.0 in | Wt 123.8 lb

## 2023-10-21 DIAGNOSIS — F411 Generalized anxiety disorder: Secondary | ICD-10-CM | POA: Diagnosis not present

## 2023-10-21 DIAGNOSIS — Z0130 Encounter for examination of blood pressure without abnormal findings: Secondary | ICD-10-CM

## 2023-10-21 DIAGNOSIS — Z1159 Encounter for screening for other viral diseases: Secondary | ICD-10-CM

## 2023-10-21 DIAGNOSIS — R5383 Other fatigue: Secondary | ICD-10-CM | POA: Diagnosis not present

## 2023-10-21 DIAGNOSIS — R7303 Prediabetes: Secondary | ICD-10-CM

## 2023-10-21 DIAGNOSIS — E538 Deficiency of other specified B group vitamins: Secondary | ICD-10-CM | POA: Diagnosis not present

## 2023-10-21 DIAGNOSIS — E559 Vitamin D deficiency, unspecified: Secondary | ICD-10-CM

## 2023-10-21 DIAGNOSIS — E782 Mixed hyperlipidemia: Secondary | ICD-10-CM

## 2023-10-21 DIAGNOSIS — Z114 Encounter for screening for human immunodeficiency virus [HIV]: Secondary | ICD-10-CM | POA: Diagnosis not present

## 2023-10-21 DIAGNOSIS — F1020 Alcohol dependence, uncomplicated: Secondary | ICD-10-CM | POA: Diagnosis not present

## 2023-10-21 MED ORDER — ALPRAZOLAM 0.25 MG PO TABS: 0.2500 mg | ORAL_TABLET | Freq: Three times a day (TID) | ORAL | 2 refills | Status: DC | PRN

## 2023-10-21 MED ORDER — TRI-SPRINTEC 0.18/0.215/0.25 MG-35 MCG PO TABS: 1.0000 | ORAL_TABLET | Freq: Every day | ORAL | 0 refills | Status: DC

## 2023-10-21 MED ORDER — AMOXICILLIN-POT CLAVULANATE 875-125 MG PO TABS: 1.0000 | ORAL_TABLET | Freq: Two times a day (BID) | ORAL | 0 refills | Status: DC
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
## 2023-10-22 LAB — CBC WITH DIFFERENTIAL/PLATELET
Basophils Absolute: 0 10*3/uL (ref 0.0–0.2)
Basos: 0 %
EOS (ABSOLUTE): 0 10*3/uL (ref 0.0–0.4)
Eos: 0 %
Hematocrit: 42.7 % (ref 34.0–46.6)
Hemoglobin: 13.9 g/dL (ref 11.1–15.9)
Immature Grans (Abs): 0 10*3/uL (ref 0.0–0.1)
Immature Granulocytes: 0 %
Lymphocytes Absolute: 3 10*3/uL (ref 0.7–3.1)
Lymphs: 38 %
MCH: 30.8 pg (ref 26.6–33.0)
MCHC: 32.6 g/dL (ref 31.5–35.7)
MCV: 95 fL (ref 79–97)
Monocytes Absolute: 0.3 10*3/uL (ref 0.1–0.9)
Monocytes: 4 %
Neutrophils Absolute: 4.4 10*3/uL (ref 1.4–7.0)
Neutrophils: 58 %
Platelets: 257 10*3/uL (ref 150–450)
RBC: 4.52 x10E6/uL (ref 3.77–5.28)
RDW: 11.8 % (ref 11.7–15.4)
WBC: 7.7 10*3/uL (ref 3.4–10.8)

## 2023-10-22 LAB — VITAMIN B12: Vitamin B-12: 526 pg/mL (ref 232–1245)

## 2023-10-22 LAB — CMP14+EGFR
ALT: 14 [IU]/L (ref 0–32)
AST: 18 [IU]/L (ref 0–40)
Albumin: 4.6 g/dL (ref 3.9–4.9)
Alkaline Phosphatase: 56 [IU]/L (ref 44–121)
BUN/Creatinine Ratio: 15 (ref 9–23)
BUN: 9 mg/dL (ref 6–24)
Bilirubin Total: 0.4 mg/dL (ref 0.0–1.2)
CO2: 22 mmol/L (ref 20–29)
Calcium: 9.7 mg/dL (ref 8.7–10.2)
Chloride: 101 mmol/L (ref 96–106)
Creatinine, Ser: 0.62 mg/dL (ref 0.57–1.00)
Globulin, Total: 2.4 g/dL (ref 1.5–4.5)
Glucose: 78 mg/dL (ref 70–99)
Potassium: 4.5 mmol/L (ref 3.5–5.2)
Sodium: 141 mmol/L (ref 134–144)
Total Protein: 7 g/dL (ref 6.0–8.5)
eGFR: 113 mL/min/{1.73_m2} (ref >59)

## 2023-10-22 LAB — VITAMIN D 25 HYDROXY (VIT D DEFICIENCY, FRACTURES): Vit D, 25-Hydroxy: 55.4 ng/mL (ref 30.0–100.0)

## 2023-10-22 LAB — LIPID PANEL
Chol/HDL Ratio: 1.8 ratio (ref 0.0–4.4)
Cholesterol, Total: 140 mg/dL (ref 100–199)
HDL: 79 mg/dL (ref >39)
LDL Chol Calc (NIH): 48 mg/dL (ref 0–99)
Triglycerides: 62 mg/dL (ref 0–149)
VLDL Cholesterol Cal: 13 mg/dL (ref 5–40)

## 2023-10-22 LAB — TSH: TSH: 0.675 u[IU]/mL (ref 0.450–4.500)

## 2023-10-22 LAB — HCV INTERPRETATION

## 2023-10-22 LAB — HEMOGLOBIN A1C
Est. average glucose Bld gHb Est-mCnc: 108 mg/dL
Hgb A1c MFr Bld: 5.4 % (ref 4.8–5.6)

## 2023-10-22 LAB — HCV AB W REFLEX TO QUANT PCR: HCV Ab: NONREACTIVE

## 2023-10-22 LAB — HIV ANTIBODY (ROUTINE TESTING W REFLEX): HIV Screen 4th Generation wRfx: NONREACTIVE

## 2023-11-01 ENCOUNTER — Telehealth: Payer: 59 | Admitting: Family Medicine

## 2023-11-01 DIAGNOSIS — J069 Acute upper respiratory infection, unspecified: Secondary | ICD-10-CM | POA: Diagnosis not present

## 2023-11-01 MED ORDER — AZELASTINE HCL 0.1 % NA SOLN: 2.0000 | Freq: Two times a day (BID) | NASAL | 0 refills | Status: DC

## 2023-11-01 MED ORDER — PSEUDOEPH-BROMPHEN-DM 30-2-10 MG/5ML PO SYRP: 5.0000 mL | ORAL_SOLUTION | Freq: Four times a day (QID) | ORAL | 0 refills | Status: DC | PRN

## 2023-11-01 MED ORDER — BENZONATATE 100 MG PO CAPS: 100.0000 mg | ORAL_CAPSULE | Freq: Two times a day (BID) | ORAL | 0 refills | Status: DC | PRN

## 2023-11-01 NOTE — Addendum Note (Signed)
Addended by: Freddy Finner on: 11/01/2023 11:45 AM   Modules accepted: Orders

## 2023-11-01 NOTE — Progress Notes (Signed)

## 2023-11-16 NOTE — Progress Notes (Signed)
Patient notified

## 2023-11-21 ENCOUNTER — Telehealth: Payer: 59 | Admitting: Physician Assistant

## 2023-11-21 DIAGNOSIS — R3989 Other symptoms and signs involving the genitourinary system: Secondary | ICD-10-CM | POA: Diagnosis not present

## 2023-11-21 MED ORDER — CEPHALEXIN 500 MG PO CAPS: 500.0000 mg | ORAL_CAPSULE | Freq: Two times a day (BID) | ORAL | 0 refills | Status: AC

## 2023-11-21 NOTE — Progress Notes (Signed)
I have spent 5 minutes in review of e-visit questionnaire, review and updating patient chart, medical decision making and response to patient.   Mia Milan Cody Jacklynn Dehaas, PA-C    

## 2023-11-21 NOTE — Progress Notes (Signed)

## 2023-11-21 NOTE — Progress Notes (Signed)
Message sent to patient requesting further input regarding current symptoms. Awaiting patient response.  

## 2023-11-27 ENCOUNTER — Encounter: Payer: Self-pay | Admitting: Family

## 2023-11-27 NOTE — Assessment & Plan Note (Signed)
Patient stable.  Well controlled with current therapy.   Continue current meds.  

## 2023-11-27 NOTE — Assessment & Plan Note (Signed)
Patient continues to abstain from drinking. Support measures discussed.  Reassess at follow-up

## 2023-12-12 ENCOUNTER — Encounter: Payer: Self-pay | Admitting: Family

## 2023-12-13 ENCOUNTER — Other Ambulatory Visit: Payer: Self-pay

## 2023-12-13 MED ORDER — AMOXICILLIN-POT CLAVULANATE 875-125 MG PO TABS: 1.0000 | ORAL_TABLET | Freq: Two times a day (BID) | ORAL | 0 refills | Status: DC

## 2024-01-23 ENCOUNTER — Other Ambulatory Visit: Payer: Self-pay | Admitting: Family

## 2024-01-24 MED ORDER — TRI-SPRINTEC 0.18/0.215/0.25 MG-35 MCG PO TABS: 1.0000 | ORAL_TABLET | Freq: Every day | ORAL | 0 refills | Status: AC

## 2024-02-20 DIAGNOSIS — N920 Excessive and frequent menstruation with regular cycle: Secondary | ICD-10-CM | POA: Diagnosis not present

## 2024-02-20 DIAGNOSIS — Z01411 Encounter for gynecological examination (general) (routine) with abnormal findings: Secondary | ICD-10-CM | POA: Diagnosis not present

## 2024-02-20 DIAGNOSIS — R102 Pelvic and perineal pain: Secondary | ICD-10-CM | POA: Diagnosis not present

## 2024-02-20 DIAGNOSIS — R87621 Atypical squamous cells cannot exclude high grade squamous intraepithelial lesion on cytologic smear of vagina (ASC-H): Secondary | ICD-10-CM | POA: Diagnosis not present

## 2024-02-20 DIAGNOSIS — Z1331 Encounter for screening for depression: Secondary | ICD-10-CM | POA: Diagnosis not present

## 2024-02-20 DIAGNOSIS — Z1339 Encounter for screening examination for other mental health and behavioral disorders: Secondary | ICD-10-CM | POA: Diagnosis not present

## 2024-02-26 ENCOUNTER — Other Ambulatory Visit: Payer: Self-pay | Admitting: Family

## 2024-03-30 ENCOUNTER — Encounter: Payer: Self-pay | Admitting: Family

## 2024-03-30 ENCOUNTER — Ambulatory Visit (INDEPENDENT_AMBULATORY_CARE_PROVIDER_SITE_OTHER): Admitting: Family

## 2024-03-30 VITALS — BP 102/70 | HR 83 | Ht 62.0 in | Wt 124.6 lb

## 2024-03-30 DIAGNOSIS — R7303 Prediabetes: Secondary | ICD-10-CM | POA: Diagnosis not present

## 2024-03-30 DIAGNOSIS — E782 Mixed hyperlipidemia: Secondary | ICD-10-CM

## 2024-03-30 DIAGNOSIS — E559 Vitamin D deficiency, unspecified: Secondary | ICD-10-CM

## 2024-03-30 DIAGNOSIS — R5383 Other fatigue: Secondary | ICD-10-CM | POA: Diagnosis not present

## 2024-03-30 DIAGNOSIS — F1020 Alcohol dependence, uncomplicated: Secondary | ICD-10-CM | POA: Diagnosis not present

## 2024-03-30 DIAGNOSIS — E538 Deficiency of other specified B group vitamins: Secondary | ICD-10-CM

## 2024-03-30 DIAGNOSIS — F411 Generalized anxiety disorder: Secondary | ICD-10-CM | POA: Diagnosis not present

## 2024-03-31 LAB — LIPID PANEL
Chol/HDL Ratio: 1.8 ratio (ref 0.0–4.4)
Cholesterol, Total: 138 mg/dL (ref 100–199)
HDL: 76 mg/dL (ref >39)
LDL Chol Calc (NIH): 46 mg/dL (ref 0–99)
Triglycerides: 87 mg/dL (ref 0–149)
VLDL Cholesterol Cal: 16 mg/dL (ref 5–40)

## 2024-03-31 LAB — CBC WITH DIFFERENTIAL/PLATELET
Basophils Absolute: 0 10*3/uL (ref 0.0–0.2)
Basos: 1 %
EOS (ABSOLUTE): 0 10*3/uL (ref 0.0–0.4)
Eos: 0 %
Hematocrit: 39.9 % (ref 34.0–46.6)
Hemoglobin: 13.2 g/dL (ref 11.1–15.9)
Immature Grans (Abs): 0 10*3/uL (ref 0.0–0.1)
Immature Granulocytes: 0 %
Lymphocytes Absolute: 3.4 10*3/uL — ABNORMAL HIGH (ref 0.7–3.1)
Lymphs: 45 %
MCH: 31.4 pg (ref 26.6–33.0)
MCHC: 33.1 g/dL (ref 31.5–35.7)
MCV: 95 fL (ref 79–97)
Monocytes Absolute: 0.3 10*3/uL (ref 0.1–0.9)
Monocytes: 4 %
Neutrophils Absolute: 3.8 10*3/uL (ref 1.4–7.0)
Neutrophils: 50 %
Platelets: 255 10*3/uL (ref 150–450)
RBC: 4.2 x10E6/uL (ref 3.77–5.28)
RDW: 11.8 % (ref 11.7–15.4)
WBC: 7.7 10*3/uL (ref 3.4–10.8)

## 2024-03-31 LAB — VITAMIN B12: Vitamin B-12: 604 pg/mL (ref 232–1245)

## 2024-03-31 LAB — CMP14+EGFR
ALT: 9 IU/L (ref 0–32)
AST: 15 IU/L (ref 0–40)
Albumin: 4.4 g/dL (ref 3.9–4.9)
Alkaline Phosphatase: 57 IU/L (ref 44–121)
BUN/Creatinine Ratio: 18 (ref 9–23)
BUN: 11 mg/dL (ref 6–24)
Bilirubin Total: 0.3 mg/dL (ref 0.0–1.2)
CO2: 22 mmol/L (ref 20–29)
Calcium: 9.4 mg/dL (ref 8.7–10.2)
Chloride: 102 mmol/L (ref 96–106)
Creatinine, Ser: 0.62 mg/dL (ref 0.57–1.00)
Globulin, Total: 2.2 g/dL (ref 1.5–4.5)
Glucose: 84 mg/dL (ref 70–99)
Potassium: 4.5 mmol/L (ref 3.5–5.2)
Sodium: 137 mmol/L (ref 134–144)
Total Protein: 6.6 g/dL (ref 6.0–8.5)
eGFR: 113 mL/min/{1.73_m2} (ref >59)

## 2024-03-31 LAB — IRON,TIBC AND FERRITIN PANEL
Ferritin: 37 ng/mL (ref 15–150)
Iron Saturation: 38 % (ref 15–55)
Iron: 121 ug/dL (ref 27–159)
Total Iron Binding Capacity: 322 ug/dL (ref 250–450)
UIBC: 201 ug/dL (ref 131–425)

## 2024-03-31 LAB — HEMOGLOBIN A1C
Est. average glucose Bld gHb Est-mCnc: 105 mg/dL
Hgb A1c MFr Bld: 5.3 % (ref 4.8–5.6)

## 2024-03-31 LAB — VITAMIN D 25 HYDROXY (VIT D DEFICIENCY, FRACTURES): Vit D, 25-Hydroxy: 43.2 ng/mL (ref 30.0–100.0)

## 2024-03-31 LAB — TSH: TSH: 0.887 u[IU]/mL (ref 0.450–4.500)

## 2024-04-20 ENCOUNTER — Ambulatory Visit: Payer: 59 | Admitting: Family

## 2024-05-01 ENCOUNTER — Other Ambulatory Visit: Payer: Self-pay | Admitting: Family

## 2024-06-05 ENCOUNTER — Other Ambulatory Visit: Payer: Self-pay | Admitting: Family

## 2024-06-28 NOTE — Assessment & Plan Note (Signed)
 Patient stable.  Well controlled with current therapy.   Continue current meds.

## 2024-06-28 NOTE — Assessment & Plan Note (Signed)
 In Remission.  Patient stable.
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
## 2024-08-24 ENCOUNTER — Ambulatory Visit (INDEPENDENT_AMBULATORY_CARE_PROVIDER_SITE_OTHER): Admitting: Family

## 2024-08-24 ENCOUNTER — Encounter: Payer: Self-pay | Admitting: Family

## 2024-08-24 VITALS — BP 102/62 | HR 63 | Ht 62.0 in | Wt 124.8 lb

## 2024-08-24 DIAGNOSIS — Z131 Encounter for screening for diabetes mellitus: Secondary | ICD-10-CM | POA: Diagnosis not present

## 2024-08-24 DIAGNOSIS — F411 Generalized anxiety disorder: Secondary | ICD-10-CM | POA: Diagnosis not present

## 2024-08-24 DIAGNOSIS — F1020 Alcohol dependence, uncomplicated: Secondary | ICD-10-CM

## 2024-08-24 DIAGNOSIS — Z720 Tobacco use: Secondary | ICD-10-CM | POA: Diagnosis not present

## 2024-08-24 DIAGNOSIS — F1021 Alcohol dependence, in remission: Secondary | ICD-10-CM | POA: Diagnosis not present

## 2024-08-24 DIAGNOSIS — Z0130 Encounter for examination of blood pressure without abnormal findings: Secondary | ICD-10-CM

## 2024-08-24 NOTE — Assessment & Plan Note (Addendum)
-   Encouraged patient smoking cessation. She is trying to activity reduce her smoking

## 2024-08-24 NOTE — Assessment & Plan Note (Addendum)
-   Discussed medication management. Well controlled with current medication; continue as prescribed. - Will check urine tox today
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
## 2024-08-24 NOTE — Assessment & Plan Note (Addendum)
-   patient in remission for years.  - will continue to monitor as appropriate.

## 2024-08-25 ENCOUNTER — Encounter: Payer: Self-pay | Admitting: Family

## 2024-08-25 MED ORDER — ALPRAZOLAM 0.25 MG PO TABS: 0.2500 mg | ORAL_TABLET | Freq: Three times a day (TID) | ORAL | 2 refills | Status: DC | PRN

## 2024-09-21 ENCOUNTER — Encounter: Payer: Self-pay | Admitting: Family

## 2024-10-10 ENCOUNTER — Other Ambulatory Visit: Payer: Self-pay | Admitting: Family

## 2024-10-19 ENCOUNTER — Encounter: Payer: Self-pay | Admitting: Family

## 2024-10-19 ENCOUNTER — Ambulatory Visit: Admitting: Family

## 2024-10-19 MED ORDER — AZITHROMYCIN 250 MG PO TABS: ORAL_TABLET | ORAL | 0 refills | Status: AC

## 2024-10-19 NOTE — Progress Notes (Unsigned)
   Acute Office Visit  Subjective:     Patient ID: Robin Compton, female    DOB: 05-14-1981, 43 y.o.   MRN: [id number]  Patient is in today for  Chief Complaint  Patient presents with   Follow-up    Right ear stopped up    Having a pain in her right ear.   Neck feels stiff and heavy.       Review of Systems  HENT:  Positive for ear pain.         Objective:    BP 124/84   Pulse 74   Ht 5' 2 (1.575 m)   Wt 123 lb 9.6 oz (56.1 kg)   SpO2 99%   BMI 22.61 kg/m   Physical Exam  No results found for any visits on 10/19/24.  No results found for this or any previous visit (from the past 2160 hours).  Allergies as of 10/19/2024   No Known Allergies      Medication List        Accurate as of October 19, 2024  2:17 PM. If you have any questions, ask your nurse or doctor.          ALPRAZolam  0.25 MG tablet Commonly known as: XANAX  TAKE 1 TABLET BY MOUTH THREE TIMES A DAY AS NEEDED FOR ANXIETY   cetirizine 5 MG tablet Commonly known as: ZYRTEC Take 5 mg by mouth daily.   Tri-Sprintec  0.18/0.215/0.25 MG-35 MCG tablet Generic drug: Norgestimate-Ethinyl Estradiol Triphasic Take 1 tablet by mouth daily.            Assessment & Plan:   Assessment & Plan    No follow-ups on file.  Total time spent: {AMA time spent:29001} minutes  ALAN CHRISTELLA ARRANT, FNP  10/19/2024   This document may have been prepared by [facility] Voice Recognition software and as such may include unintentional dictation errors.

## 2024-10-23 ENCOUNTER — Encounter: Payer: Self-pay | Admitting: Family

## 2024-11-16 ENCOUNTER — Ambulatory Visit: Admitting: Family

## 2024-12-01 ENCOUNTER — Other Ambulatory Visit: Payer: Self-pay | Admitting: Family

## 2024-12-18 ENCOUNTER — Encounter: Payer: Self-pay | Admitting: Family

## 2024-12-19 MED ORDER — AMOXICILLIN-POT CLAVULANATE 875-125 MG PO TABS: 1.0000 | ORAL_TABLET | Freq: Two times a day (BID) | ORAL | 0 refills | Status: AC

## 2024-12-21 ENCOUNTER — Ambulatory Visit: Payer: Self-pay | Admitting: Family

## 2025-01-04 ENCOUNTER — Ambulatory Visit: Payer: Self-pay | Admitting: Family

## 2025-01-11 ENCOUNTER — Ambulatory Visit: Payer: Self-pay | Admitting: Family
